# Patient Record
Sex: Male | Born: 1985 | Race: White | Hispanic: No | Marital: Married | Smoking: Current every day smoker
Health system: Southern US, Community
[De-identification: ages and names within clinical notes are randomized; demographics above are authoritative.]

## PROBLEM LIST (undated history)

## (undated) DIAGNOSIS — M549 Dorsalgia, unspecified: Secondary | ICD-10-CM

## (undated) DIAGNOSIS — R110 Nausea: Secondary | ICD-10-CM

## (undated) DIAGNOSIS — M62830 Muscle spasm of back: Secondary | ICD-10-CM

## (undated) HISTORY — DX: Muscle spasm of back: M62.830

## (undated) HISTORY — DX: Dorsalgia, unspecified: M54.9

## (undated) HISTORY — DX: Nausea: R11.0

---
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
## 2012-06-26 ENCOUNTER — Emergency Department (HOSPITAL_COMMUNITY)
Admission: EM | Admit: 2012-06-26 | Discharge: 2012-06-26 | Disposition: A | Discharge status: Home / Self Care | Payer: Self-pay | Attending: Emergency Medicine | Admitting: Emergency Medicine

## 2012-06-26 ENCOUNTER — Encounter (HOSPITAL_COMMUNITY): Payer: Self-pay | Admitting: Emergency Medicine

## 2012-06-26 DIAGNOSIS — R197 Diarrhea, unspecified: Secondary | ICD-10-CM | POA: Insufficient documentation

## 2012-06-26 DIAGNOSIS — R112 Nausea with vomiting, unspecified: Secondary | ICD-10-CM | POA: Insufficient documentation

## 2012-06-26 DIAGNOSIS — IMO0001 Reserved for inherently not codable concepts without codable children: Secondary | ICD-10-CM | POA: Insufficient documentation

## 2012-06-26 DIAGNOSIS — R229 Localized swelling, mass and lump, unspecified: Secondary | ICD-10-CM | POA: Insufficient documentation

## 2012-06-26 DIAGNOSIS — Z87891 Personal history of nicotine dependence: Secondary | ICD-10-CM | POA: Insufficient documentation

## 2012-06-26 DIAGNOSIS — R1033 Periumbilical pain: Secondary | ICD-10-CM | POA: Insufficient documentation

## 2012-06-26 LAB — BASIC METABOLIC PANEL
GFR calc non Af Amer: >90 mL/min (ref >90)
Glucose, Bld: 84 mg/dL (ref 70–99)
Sodium: 142 mEq/L (ref 135–145)

## 2012-06-26 LAB — CBC WITH DIFFERENTIAL/PLATELET
Basophils Absolute: 0 10*3/uL (ref 0.0–0.1)
Basophils Relative: 0 % (ref 0–1)
Eosinophils Absolute: 0.1 10*3/uL (ref 0.0–0.7)
Eosinophils Relative: 2 % (ref 0–5)
Lymphs Abs: 1 10*3/uL (ref 0.7–4.0)
MCHC: 36 g/dL (ref 30.0–36.0)
Monocytes Absolute: 0.6 10*3/uL (ref 0.1–1.0)
Monocytes Relative: 10 % (ref 3–12)
RDW: 12.7 % (ref 11.5–15.5)
WBC: 5.7 10*3/uL (ref 4.0–10.5)

## 2012-06-26 MED ORDER — ONDANSETRON 4 MG PO TBDP: 4.0000 mg | ORAL_TABLET | Freq: Three times a day (TID) | ORAL | Status: DC | PRN

## 2012-06-26 MED ORDER — SODIUM CHLORIDE 0.9 % IV BOLUS (SEPSIS)
1000.0000 mL | Freq: Once | INTRAVENOUS | Status: AC
  Administered 2012-06-26: 1000 mL via INTRAVENOUS

## 2012-06-26 MED ORDER — ONDANSETRON HCL 4 MG/2ML IJ SOLN
4.0000 mg | Freq: Once | INTRAMUSCULAR | Status: AC
  Administered 2012-06-26: 4 mg via INTRAVENOUS
  Filled 2012-06-26: qty 2

## 2012-06-26 NOTE — ED Notes (Signed)
States has had "flu like symptoms" for two days- said vomited blood "bright red approx 1 T this am" vomited once last night, diarrhea for 2 days. Has not taken temp at home but says had chills and sweats at night

## 2012-06-26 NOTE — ED Notes (Signed)
Pt has small pea sized knot in left lower quad, pea sized knot right upper quad, pea sized knot on back of right and left upper arms.

## 2012-06-26 NOTE — ED Provider Notes (Signed)
History     CSN: [id number]  Arrival date & time 06/26/12  0712   First MD Initiated Contact with Patient 06/26/12 [phone]      Chief Complaint  Patient presents with  . Emesis  . Generalized Body Aches    (Consider location/radiation/quality/duration/timing/severity/associated sxs/prior treatment) HPI Comments: N/v/ hemoptysis, sweates, chills, runny nose and 1 episode of hematemsis.  Periumbilical pain 3/10 in intensity which is intermittent.  pepto helps.  Nothing makes worse, 4 episodes of diarrhea which was watery.  Sx started 2 days ago, acute in onset.  He is concerned about hemoptysis.   Patient is a 27 y.o. male presenting with vomiting. The history is provided by the patient.  Emesis   History reviewed. No pertinent past medical history.  Past Surgical History  Procedure Laterality Date  . Hernia repair  1995    No family history on file.  History  Substance Use Topics  . Smoking status: Former Games developer  . Smokeless tobacco: Not on file  . Alcohol Use: Yes     Comment: socially      Review of Systems  Gastrointestinal: Positive for vomiting.  All other systems reviewed and are negative.    Allergies  Review of patient's allergies indicates no known allergies.  Home Medications   Current Outpatient Rx  Name  Route  Sig  Dispense  Refill  . ibuprofen (ADVIL,MOTRIN) 200 MG tablet   Oral   Take 200 mg by mouth every 6 (six) hours as needed for pain.           BP 130/72  Pulse 83  Temp(Src) 98 F (36.7 C) (Oral)  Resp 16  SpO2 97%  Physical Exam  Nursing note and vitals reviewed. Constitutional: He appears well-developed and well-nourished. No distress.  HENT:  Head: Normocephalic and atraumatic.  Mouth/Throat: Oropharynx is clear and moist. No oropharyngeal exudate.  Is opacity is clear, oropharynx is clear and moist, no erythema exudate asymmetry or hypertrophy, no blood in the oropharynx  Eyes: Conjunctivae and EOM are normal. Pupils are  equal, round, and reactive to light. Right eye exhibits no discharge. Left eye exhibits no discharge. No scleral icterus.  Neck: Normal range of motion. Neck supple. No JVD present. No thyromegaly present.  Cardiovascular: Normal rate, regular rhythm, normal heart sounds and intact distal pulses.  Exam reveals no gallop and no friction rub.   No murmur heard. Pulmonary/Chest: Effort normal and breath sounds normal. No respiratory distress. He has no wheezes. He has no rales.  Abdominal: Soft. Bowel sounds are normal. He exhibits no distension and no mass. There is tenderness ( Abdomen is nontender, soft, round, obese, subcutaneous nodule in the right upper quadrant which is nontender and mobile in robbery).  Musculoskeletal: Normal range of motion. He exhibits no edema and no tenderness.  Lymphadenopathy:    He has no cervical adenopathy.  Neurological: He is alert. Coordination normal.  Skin: Skin is warm and dry. No rash noted. No erythema.  Psychiatric: He has a normal mood and affect. His behavior is normal.    ED Course  Procedures (including critical care time)  Labs Reviewed  CBC WITH DIFFERENTIAL  BASIC METABOLIC PANEL   No results found.   1. Nausea vomiting and diarrhea   2. Subcutaneous nodules       MDM  Overall the patient appears well, he has normal vital signs, he has nausea vomiting and several episodes of watery diarrhea over the last several days, one episode of hematemesis  today. I doubt that this is a significant source, he has no history of peptic ulcer disease that he reports to nursing, he does not take any medications for gastritis, acid reflux or peptic ulcer disease. We'll provide the patient with antiemetics, IV fluids, blood counts to evaluate for source of these nodules which are subcutaneous.   Labs are normal, normal potassium and renal function, normal blood counts, patient has improved with Zofran and fluids, stable for discharge. Refer to family Dr.  for nodules and ongoing nausea.     Vida Roller, MD 06/26/12 [phone]

## 2013-07-28 ENCOUNTER — Emergency Department (HOSPITAL_COMMUNITY): Payer: Managed Care, Other (non HMO)

## 2013-07-28 ENCOUNTER — Encounter (HOSPITAL_COMMUNITY): Payer: Self-pay | Admitting: Emergency Medicine

## 2013-07-28 ENCOUNTER — Emergency Department (HOSPITAL_COMMUNITY)
Admission: EM | Admit: 2013-07-28 | Discharge: 2013-07-28 | Disposition: A | Discharge status: Home / Self Care | Payer: Managed Care, Other (non HMO) | Attending: Emergency Medicine | Admitting: Emergency Medicine

## 2013-07-28 DIAGNOSIS — Z87891 Personal history of nicotine dependence: Secondary | ICD-10-CM | POA: Insufficient documentation

## 2013-07-28 DIAGNOSIS — R109 Unspecified abdominal pain: Secondary | ICD-10-CM

## 2013-07-28 DIAGNOSIS — M545 Low back pain, unspecified: Secondary | ICD-10-CM | POA: Insufficient documentation

## 2013-07-28 DIAGNOSIS — M549 Dorsalgia, unspecified: Secondary | ICD-10-CM

## 2013-07-28 DIAGNOSIS — R6883 Chills (without fever): Secondary | ICD-10-CM | POA: Insufficient documentation

## 2013-07-28 DIAGNOSIS — R3919 Other difficulties with micturition: Secondary | ICD-10-CM | POA: Insufficient documentation

## 2013-07-28 DIAGNOSIS — R61 Generalized hyperhidrosis: Secondary | ICD-10-CM | POA: Insufficient documentation

## 2013-07-28 DIAGNOSIS — R1031 Right lower quadrant pain: Secondary | ICD-10-CM | POA: Insufficient documentation

## 2013-07-28 DIAGNOSIS — R112 Nausea with vomiting, unspecified: Secondary | ICD-10-CM | POA: Insufficient documentation

## 2013-07-28 LAB — BASIC METABOLIC PANEL
BUN: 16 mg/dL (ref 6–23)
CO2: 24 mEq/L (ref 19–32)
Calcium: 9.8 mg/dL (ref 8.4–10.5)
Chloride: 100 mEq/L (ref 96–112)
Creatinine, Ser: 0.92 mg/dL (ref 0.50–1.35)
GFR calc Af Amer: >90 mL/min (ref >90)
Glucose, Bld: 132 mg/dL — ABNORMAL HIGH (ref 70–99)
POTASSIUM: 3.8 meq/L (ref 3.7–5.3)
SODIUM: 139 meq/L (ref 137–147)

## 2013-07-28 LAB — URINALYSIS, ROUTINE W REFLEX MICROSCOPIC
Glucose, UA: NEGATIVE mg/dL
Hgb urine dipstick: NEGATIVE
Ketones, ur: 15 mg/dL — AB
LEUKOCYTES UA: NEGATIVE
Nitrite: NEGATIVE
PROTEIN: NEGATIVE mg/dL
Specific Gravity, Urine: 1.035 — ABNORMAL HIGH (ref 1.005–1.030)
Urobilinogen, UA: 0.2 mg/dL (ref 0.0–1.0)
pH: 6 (ref 5.0–8.0)

## 2013-07-28 LAB — CBC
HCT: 45.1 % (ref 39.0–52.0)
HEMOGLOBIN: 16.1 g/dL (ref 13.0–17.0)
MCH: 30.8 pg (ref 26.0–34.0)
MCHC: 35.7 g/dL (ref 30.0–36.0)
MCV: 86.4 fL (ref 78.0–100.0)
Platelets: 240 10*3/uL (ref 150–400)
RBC: 5.22 MIL/uL (ref 4.22–5.81)
RDW: 12.9 % (ref 11.5–15.5)
WBC: 6.9 10*3/uL (ref 4.0–10.5)

## 2013-07-28 MED ORDER — IOHEXOL 300 MG/ML  SOLN
100.0000 mL | Freq: Once | INTRAMUSCULAR | Status: AC | PRN
  Administered 2013-07-28: 100 mL via INTRAVENOUS

## 2013-07-28 MED ORDER — CYCLOBENZAPRINE HCL 10 MG PO TABS: 10.0000 mg | ORAL_TABLET | Freq: Two times a day (BID) | ORAL | Status: DC | PRN

## 2013-07-28 MED ORDER — ONDANSETRON HCL 4 MG/2ML IJ SOLN
4.0000 mg | Freq: Once | INTRAMUSCULAR | Status: AC
  Administered 2013-07-28: 4 mg via INTRAVENOUS
  Filled 2013-07-28: qty 2

## 2013-07-28 MED ORDER — SODIUM CHLORIDE 0.9 % IV BOLUS (SEPSIS)
1000.0000 mL | Freq: Once | INTRAVENOUS | Status: AC
  Administered 2013-07-28: 1000 mL via INTRAVENOUS

## 2013-07-28 MED ORDER — MORPHINE SULFATE 4 MG/ML IJ SOLN
4.0000 mg | Freq: Once | INTRAMUSCULAR | Status: AC
  Administered 2013-07-28: 4 mg via INTRAVENOUS
  Filled 2013-07-28: qty 1

## 2013-07-28 MED ORDER — IOHEXOL 300 MG/ML  SOLN: 25.0000 mL | INTRAMUSCULAR | Status: AC

## 2013-07-28 MED ORDER — HYDROCODONE-ACETAMINOPHEN 5-325 MG PO TABS: 1.0000 | ORAL_TABLET | ORAL | Status: DC | PRN

## 2013-07-28 MED ORDER — PROMETHAZINE HCL 25 MG PO TABS: 25.0000 mg | ORAL_TABLET | Freq: Four times a day (QID) | ORAL | Status: AC | PRN

## 2013-07-28 NOTE — ED Notes (Signed)
He woke this am with severe R sided flank, hip and lower back pain as well as urinary hesitancy.

## 2013-07-28 NOTE — Discharge Instructions (Signed)
Stay hydrated.   Take vicodin for pain. Do NOT drive with it.   Take phenergan for nausea.  Take flexeril for muscle spasms.   Follow up with your doctor.   Return to ER if you have severe pain, vomiting, dehydration.

## 2013-07-28 NOTE — ED Provider Notes (Signed)
CSN: [id number]     Arrival date & time 07/28/13  1203 History   First MD Initiated Contact with Patient 07/28/13 1425     Chief Complaint  Patient presents with  . Back Pain     (Consider location/radiation/quality/duration/timing/severity/associated sxs/prior Treatment) HPI Comments: Joel Mcclain is a 28 y.o. male with a past medical history of hernia repair presenting the Emergency Department with a chief complaint of Right flank pain since this morning. States RLQ discomfort as sharp cramping discomfort. Right low back pain as constant, radiant discomfort.  He reports standing increase the discomfort and leaning to right side partially relieves symptoms.  He reports multiple episodes of vomiting. He denies history of kidney stone.  He denies hematuria but reports decrease in stream since today. The patient also reports last BM ws yesterday, no blood or pus. Abdominal surgeries: hernia repair in 1995 and has chronic right inguinal pain.  He reports Ibuprofen use without resolution of his symptoms. No PCP    Patient is a 28 y.o. male presenting with back pain. The history is provided by the patient. No language interpreter was used.  Back Pain Associated symptoms: abdominal pain   Associated symptoms: no dysuria and no fever     History reviewed. No pertinent past medical history. Past Surgical History  Procedure Laterality Date  . Hernia repair  1995   History reviewed. No pertinent family history. History  Substance Use Topics  . Smoking status: Former Games developer  . Smokeless tobacco: Not on file  . Alcohol Use: Yes     Comment: socially    Review of Systems  Constitutional: Positive for chills and diaphoresis. Negative for fever.  Gastrointestinal: Positive for nausea, vomiting and abdominal pain. Negative for diarrhea, constipation, blood in stool and anal bleeding.  Genitourinary: Positive for difficulty urinating. Negative for dysuria, hematuria, scrotal swelling and  testicular pain.  Musculoskeletal: Positive for back pain.      Allergies  Review of patient's allergies indicates no known allergies.  Home Medications   Current Outpatient Rx  Name  Route  Sig  Dispense  Refill  . aspirin 325 MG tablet   Oral   Take 325 mg by mouth every 4 (four) hours as needed for mild pain, fever or headache.         . ibuprofen (ADVIL,MOTRIN) 200 MG tablet   Oral   Take 400 mg by mouth every 6 (six) hours as needed for fever, headache or mild pain.           BP 148/103  Pulse 74  Temp(Src) 98.1 F (36.7 C) (Oral)  Resp 22  Wt 220 lb (99.791 kg)  SpO2 100% Physical Exam  Nursing note and vitals reviewed. Constitutional: He is oriented to person, place, and time. He appears well-developed and well-nourished. No distress.  HENT:  Head: Normocephalic and atraumatic.  Eyes: EOM are normal. Pupils are equal, round, and reactive to light.  Neck: Neck supple.  Cardiovascular: Normal rate and regular rhythm.   Pulmonary/Chest: Effort normal and breath sounds normal. No respiratory distress. He has no wheezes. He has no rales.  Abdominal: Soft. Normal appearance. He exhibits no distension. There is tenderness in the right lower quadrant. There is guarding and tenderness at McBurney's point. There is no rigidity, no rebound, no CVA tenderness and negative Murphy's sign. Hernia confirmed negative in the right inguinal area and confirmed negative in the left inguinal area.  Genitourinary: Testes normal and penis normal.    Right testis  shows no swelling and no tenderness. Left testis shows no swelling and no tenderness. No penile erythema.  Moderate tenderness to right groin/ inguinal. Negative prehn's test  Musculoskeletal: Normal range of motion.       Back:  Discomfort reproducible with palpation of the right flank  Lymphadenopathy:       Right: No inguinal adenopathy present.       Left: No inguinal adenopathy present.  Neurological: He is alert  and oriented to person, place, and time.  Skin: Skin is warm and dry. No rash noted. He is not diaphoretic.  Rubbery mobile subcutaneous nodule in left lower abdomen, and right lower back. No overlying erythema.   Psychiatric: He has a normal mood and affect. His behavior is normal. Thought content normal.    ED Course  Procedures (including critical care time) Labs Review Labs Reviewed  BASIC METABOLIC PANEL - Abnormal; Notable for the following:    Glucose, Bld 132 (*)    All other components within normal limits  URINALYSIS, ROUTINE W REFLEX MICROSCOPIC - Abnormal; Notable for the following:    Specific Gravity, Urine 1.035 (*)    Bilirubin Urine SMALL (*)    Ketones, ur 15 (*)    All other components within normal limits  CBC   Imaging Review No results found.   EKG Interpretation None      MDM   Final diagnoses:  Abdominal pain  Back pain   Pt with RLQ discomfort and right low back pain since today.  Back pain reproducible with palpation, of soft tissue, No CVA tenderness.  RLQ discomfort with palpation.  Questionable appendicitis vs. Renal stone. Afebrile, no leukocytosis. UA, dehydrated without  Hbg or infection. Pt care assumed by Dr. Chaney Mallingavid Yao at shift change, awaiting CT and results.  Meds given in ED:  Medications  sodium chloride 0.9 % bolus 1,000 mL (not administered)  morphine 4 MG/ML injection 4 mg (not administered)  ondansetron (ZOFRAN) injection 4 mg (not administered)    New Prescriptions   No medications on file        Clabe SealLauren M Bryant Lipps, PA-C 07/28/13 2040

## 2013-07-28 NOTE — ED Notes (Signed)
Patient discharged to home with family. NAD.  

## 2013-07-28 NOTE — ED Provider Notes (Addendum)
  Physical Exam  BP 101/48  Pulse 66  Temp(Src) 98.1 F (36.7 C) (Oral)  Resp 18  Wt 220 lb (99.791 kg)  SpO2 98%  Physical Exam  ED Course  Procedures  Care assumed at sign out from JacksonLauren, GeorgiaPA. Sign out pending CT ab/pel for abdominal pain. CT unremarkable. Pain improved. UA showed mild dehydration. Tenderness on back muscles. I think likely MSK in nature. Felt better with meds, IVF. Will d/c home.       Richardean Canalavid H Yao, MD 07/28/13 2015  Richardean Canalavid H Yao, MD 07/28/13 2018

## 2013-07-28 NOTE — ED Notes (Signed)
Patient transported to CT 

## 2013-07-28 NOTE — ED Notes (Signed)
This NT was present with the  PA during genital exam. Tolerated well

## 2013-07-29 NOTE — ED Provider Notes (Signed)
Medical screening examination/treatment/procedure(s) were performed by non-physician practitioner and as supervising physician I was immediately available for consultation/collaboration.   EKG Interpretation None        Dagmar HaitWilliam Sevanna Ballengee, MD 07/29/13 [phone]

## 2014-01-26 ENCOUNTER — Ambulatory Visit (INDEPENDENT_AMBULATORY_CARE_PROVIDER_SITE_OTHER): Payer: Managed Care, Other (non HMO) | Admitting: Family Medicine

## 2014-01-26 ENCOUNTER — Telehealth: Payer: Self-pay | Admitting: *Deleted

## 2014-01-26 ENCOUNTER — Encounter: Payer: Self-pay | Admitting: Family Medicine

## 2014-01-26 VITALS — BP 100/74 | HR 83 | Temp 98.1°F | Ht 68.75 in | Wt 215.5 lb

## 2014-01-26 DIAGNOSIS — F418 Other specified anxiety disorders: Secondary | ICD-10-CM

## 2014-01-26 DIAGNOSIS — M545 Low back pain, unspecified: Secondary | ICD-10-CM

## 2014-01-26 DIAGNOSIS — Z7189 Other specified counseling: Secondary | ICD-10-CM

## 2014-01-26 DIAGNOSIS — G8929 Other chronic pain: Secondary | ICD-10-CM

## 2014-01-26 DIAGNOSIS — F329 Major depressive disorder, single episode, unspecified: Secondary | ICD-10-CM

## 2014-01-26 DIAGNOSIS — Z7689 Persons encountering health services in other specified circumstances: Secondary | ICD-10-CM

## 2014-01-26 DIAGNOSIS — G2581 Restless legs syndrome: Secondary | ICD-10-CM

## 2014-01-26 DIAGNOSIS — F419 Anxiety disorder, unspecified: Secondary | ICD-10-CM

## 2014-01-26 DIAGNOSIS — R110 Nausea: Secondary | ICD-10-CM

## 2014-01-26 LAB — COMPREHENSIVE METABOLIC PANEL
ALT: 21 U/L (ref 0–53)
AST: 18 U/L (ref 0–37)
Albumin: 4.6 g/dL (ref 3.5–5.2)
Alkaline Phosphatase: 84 U/L (ref 39–117)
BUN: 15 mg/dL (ref 6–23)
CO2: 27 mEq/L (ref 19–32)
Calcium: 9.9 mg/dL (ref 8.4–10.5)
Chloride: 104 mEq/L (ref 96–112)
Creatinine, Ser: 1 mg/dL (ref 0.4–1.5)
GFR: 94.39 mL/min (ref >60.00)
Glucose, Bld: 78 mg/dL (ref 70–99)
Potassium: 3.9 mEq/L (ref 3.5–5.1)
Sodium: 138 mEq/L (ref 135–145)
Total Bilirubin: 1 mg/dL (ref 0.2–1.2)
Total Protein: 7.7 g/dL (ref 6.0–8.3)

## 2014-01-26 LAB — H. PYLORI ANTIBODY, IGG: H PYLORI IGG: NEGATIVE

## 2014-01-26 NOTE — Patient Instructions (Addendum)
-We have ordered labs or studies at this visit. It can take up to 1-2 weeks for results and processing. We will contact you with instructions IF your results are abnormal. Normal results will be released to your [facility]. If you have not heard from us or can not find your results in [facility] in 2 weeks please contact our office.  -get the xrays of the back  -do the back exercises AT LEAST 4 days per week  -schedule Cognitive Behavioral Therapy for the anxiety and consider a medication called Paxil  -PLEASE SIGN UP FOR MYCHART TODAY   We recommend the following healthy lifestyle measures: - eat a healthy diet consisting of lots of vegetables, fruits, beans, nuts, seeds, healthy meats such as white chicken and fish and whole grains.  - avoid fried foods, fast food, processed foods, sodas, red meet and other fattening foods.  - get a least 150 minutes of aerobic exercise per week.   Follow up in: 1 month   Treatment Options for Obesity:  A comprehensive exercise and diet program are advised and are the initial and longterm/lifelong treatment for obesity.  This is a long and initially often difficult process and it will often take years to get to reach optimal health  As you get healthier you will build extra blood volume and lean tissue that is heavier then fatty tissue and you will hit plateaus in your weight - thus I ADVISE YOU DO NOT FOLLOW YOUR WEIGHT ON A SCALE   The safest and healthiest way to improve you physical and mental health is to: - eat a healthy diet consisting of lots of vegetables, fruits, beans, nuts, seeds, healthy meats such as white chicken and fish and whole grains.  - avoid fried foods, fast food, processed foods, sodas, red meet and other fattening foods.  - get a least 150-300 minutes of aerobic exercise per week.  -reduce stress - counseling, meditation, relaxation to balance other aspects of your life   Medication for Obesity: IF BMI >30 and you have not  achieved weight loss through diet and exercise alone, we suggest pharmacologic therapy can be added to diet and exercise.  First Choice:  1)Orlistat (Xenical): -likely the safest in terms of heart and vascular side effects and helps cholesterol - usually recommended for 2 - 4 years -common and or serious side effects include but are not limited to: anaphylaxis, liver toxicity, gas, oily stools, loose stools, fatty stools -150 mg up to 3 times daily only during meals containing fat, diet should be <30% fat -not recommended in: pregnancy, malabsorption, some kidney stones, anorexia or bulemia history  2)Lorcaserin (Belviq) -no long term safety data -to be discontinued if do not achieve 5% body weight reduction in 12 weeks -common and or serious side effects include but are not limited to: heart disease, anemia, abuse and dependency, depression, suicide, nausea and vomiting, headaches, low blood sugar, dizziness, anemia, fatigue, diarrhea, urinary tract infections, back pain, dry mouth, pain, cognitive impairment 10mg  twice daily -not recommended if pregnant, kidney disease, heart disease, diabetes, depression -I do not advise long term and advise weaning of if not effective and after 1-2 years  3)combination phentermine-topiramate (Qsymia)- for men or post menopausal women or monthly pregnancy test: -can cause fetal anomalies -to be discontinued GRADUALLY if do not achieve 5% body weight reduction in 12 weeks -adverse: -not recommended if: heart disease, breastfeeding, pregnant, hyperthyroid, glaucoma, drug abuse history, renal or liver problems, alcohol use, depression -common and or serious  side effects include but are not limited to: metabolic acidosis, kidney stones, bone issues, electrolyte issues, heart attack, suicide, psychosis, dependency and abuse, severe reaction, seizure is stop suddenly, dry mouth, numbness, headache, blurred vision, diarrhea, fatigue, depression, anxiety, pain,  poor focus, acid reflux, dry eyes, eye pain, heart arhythmia -I do not advise long term and advise weaning of if not effective and in 1-2 years  If diabetes:  1) Metformin, orlistat or  2)Liraglutide (Victoza) -can cause t cell tumors, anaphylaxis, kidney toxicity, pancreatitis, nausea, vomiting, diarrhea, headache -injected:0.6mg  daily for 1 week, then 1.2mg  daily  Surgery is also an option and [facility] has many useful resources if you are considering this.  Please check out this website if you wish:  [email or web address]

## 2014-01-26 NOTE — Telephone Encounter (Signed)
I called the pt and informed him Dr Selena BattenKim completed the eHealth Screenings form and this was mailed to his home address to have him complete his email address and sign.  A copy was sent to be scanned.

## 2014-01-26 NOTE — Progress Notes (Addendum)
No chief complaint on file.   HPI:  Joel Mcclain is here to establish care. Had health screening with [employer] through his wife's job - had lipid, diabetes screening and this was normal. Told to see PCP for BMI in obese range.  Has the following chronic problems and concerns today:  There are no active problems to display for this patient.  Chronic Low back Back Pain: -started in highschool when playing sports -reports no real work up in the past -uses ibuprofen 200-400mg  almost daily - reports aleve hurt his kidneys Denies: malaise, fevers, weakness, numbness, bowel or bladder incontinence, radiation  RLS: -feels like his legs want to move -never on medication for this  ? Anxiety or Depression/Nausea: -chronically has worry about everything all the time, occ panic attacks, never in active combat, was in the [employer] -intermittent depressed mood -for over 1 year -feels nausea 3-4 days per week in the morning -once he eats breakfast he is fine -denies: weight loss, diarrhea, vomiting, constipation, melena, hematochezia, no pain -told this is anxiety, he uses phenergan for this -has never taken a medication, never hospitalized -saw a psychiatrist remotely   ROS negative for unless reported above: fevers, unintentional weight loss, hearing or vision loss, chest pain, palpitations, struggling to breath, hemoptysis, melena, hematochezia, hematuria, falls, loc, si, thoughts of self harm  Past Medical History  Diagnosis Date  . Nausea   . Back pain   . Spasm of back muscles     Family History  Problem Relation Age of Onset  . Cancer Mother     breast  . Heart disease Mother     chf  . Hyperlipidemia Sister   . Hypertension Sister     History   Social History  . Marital Status: Married    Spouse Name: N/A    Number of Children: N/A  . Years of Education: N/A   Social History Main Topics  . Smoking status: Former Games developer  . Smokeless tobacco: None     Comment:  quit in 2010  . Alcohol Use: Yes     Comment: socially; 1 drink once per month  . Drug Use: None  . Sexual Activity: None   Other Topics Concern  . None   Social History Narrative   Work or School: works as a Investment banker, operational at [employer] - used to be in [employer], not in [employer] Situation: lives with wife and one child on the way in 2016 (april 20th)      Spiritual Beliefs: Buhdist      Lifestyle: no regular exercise, diet is poor             Current outpatient prescriptions:ibuprofen (ADVIL,MOTRIN) 200 MG tablet, Take 400 mg by mouth every 6 (six) hours as needed for fever, headache or mild pain. , Disp: , Rfl: ;  promethazine (PHENERGAN) 25 MG tablet, Take 1 tablet (25 mg total) by mouth every 6 (six) hours as needed for nausea or vomiting., Disp: 20 tablet, Rfl: 0  EXAM:  Filed Vitals:   01/26/14 0934  BP: 100/74  Pulse: 83  Temp: 98.1 F (36.7 C)    Body mass index is 32.06 kg/(m^2).  GENERAL: vitals reviewed and listed above, alert, oriented, appears well hydrated and in no acute distress  HEENT: atraumatic, conjunttiva clear, no obvious abnormalities on inspection of external nose and ears  NECK: no obvious masses on inspection  LUNGS: clear to auscultation bilaterally, no wheezes, rales  or rhonchi, good air movement  CV: HRRR, no peripheral edema  ABD: BS+, sofft, NTTP  Normal Gait Normal inspection of back, no obvious scoliosis or leg length descrepancy No bony TTP bilat lumbar paraspinal muscles Soft tissue TTP at: -/+ tests: neg trendelenburg,-facet loading, -SLRT, -CLRT, -FABER, -FADIR Normal muscle strength, sensation to light touch and DTRs in LEs bilaterally  MS: moves all extremities without noticeable abnormality  PSYCH: pleasant and cooperative, no obvious depression or anxiety  ASSESSMENT AND PLAN:  Discussed the following assessment and plan: Greater then 45 minutes spent with this patient with well over 50% of the visit on counseling  on the following:  Encounter to establish care -We reviewed the PMH, PSH, FH, SH, Meds and Allergies. -We provided refills for any medications we will prescribe as needed. -We addressed current concerns per orders and patient instructions. -We have asked for records for pertinent exams, studies, vaccines and notes from previous providers. -We have advised patient to follow up per instructions below. Anxiety and depression  RLS (restless legs syndrome)  Chronic low back pain - Plan: DG Lumbar Spine Complete -benign exam -etiologies discussed -xrays, HEP, tylenol as needed, follow up in 1 month  Nausea without vomiting - Plan: CMP, H. pylori Antibody, IgG, t-Transglutaminase (tTG) IgG -we discussed possible serious and likely etiologies, workup and treatment, treatment risks and return precautions -after this discussion, Joel Mcclain opted for labs, posisble GI referral if persists and tx of anxiety -follow up advised in 1 month -of course, we advised Joel Mcclain  to return or notify a doctor immediately if symptoms worsen or persist or new concerns arise.  Obesity: -discussed treatment per below at length   -Patient advised to return or notify a doctor immediately if symptoms worsen or persist or new concerns arise.  Patient Instructions  -We have ordered labs or studies at this visit. It can take up to 1-2 weeks for results and processing. We will contact you with instructions IF your results are abnormal. Normal results will be released to your [facility]. If you have not heard from us or can not find your results in [facility] in 2 weeks please contact our office.  -get the xrays of the back  -do the back exercises AT LEAST 4 days per week  -schedule Cognitive Behavioral Therapy for the anxiety and consider a medication called Paxil  -PLEASE SIGN UP FOR MYCHART TODAY   We recommend the following healthy lifestyle measures: - eat a healthy diet consisting of lots of vegetables, fruits, beans,  nuts, seeds, healthy meats such as white chicken and fish and whole grains.  - avoid fried foods, fast food, processed foods, sodas, red meet and other fattening foods.  - get a least 150 minutes of aerobic exercise per week.   Follow up in: 1 month   Treatment Options for Obesity:  A comprehensive exercise and diet program are advised and are the initial and longterm/lifelong treatment for obesity.  This is a long and initially often difficult process and it will often take years to get to reach optimal health  As you get healthier you will build extra blood volume and lean tissue that is heavier then fatty tissue and you will hit plateaus in your weight - thus I ADVISE YOU DO NOT FOLLOW YOUR WEIGHT ON A SCALE   The safest and healthiest way to improve you physical and mental health is to: - eat a healthy diet consisting of lots of vegetables, fruits, beans, nuts, seeds, healthy meats such  as white chicken and fish and whole grains.  - avoid fried foods, fast food, processed foods, sodas, red meet and other fattening foods.  - get a least 150-300 minutes of aerobic exercise per week.  -reduce stress - counseling, meditation, relaxation to balance other aspects of your life   Medication for Obesity: IF BMI >30 and you have not achieved weight loss through diet and exercise alone, we suggest pharmacologic therapy can be added to diet and exercise.  First Choice:  1)Orlistat (Xenical): -likely the safest in terms of heart and vascular side effects and helps cholesterol - usually recommended for 2 - 4 years -common and or serious side effects include but are not limited to: anaphylaxis, liver toxicity, gas, oily stools, loose stools, fatty stools -150 mg up to 3 times daily only during meals containing fat, diet should be <30% fat -not recommended in: pregnancy, malabsorption, some kidney stones, anorexia or bulemia history  2)Lorcaserin (Belviq) -no long term safety data -to be  discontinued if do not achieve 5% body weight reduction in 12 weeks -common and or serious side effects include but are not limited to: heart disease, anemia, abuse and dependency, depression, suicide, nausea and vomiting, headaches, low blood sugar, dizziness, anemia, fatigue, diarrhea, urinary tract infections, back pain, dry mouth, pain, cognitive impairment 10mg  twice daily -not recommended if pregnant, kidney disease, heart disease, diabetes, depression -I do not advise long term and advise weaning of if not effective and after 1-2 years  3)combination phentermine-topiramate (Qsymia)- for men or post menopausal women or monthly pregnancy test: -can cause fetal anomalies -to be discontinued GRADUALLY if do not achieve 5% body weight reduction in 12 weeks -adverse: -not recommended if: heart disease, breastfeeding, pregnant, hyperthyroid, glaucoma, drug abuse history, renal or liver problems, alcohol use, depression -common and or serious side effects include but are not limited to: metabolic acidosis, kidney stones, bone issues, electrolyte issues, heart attack, suicide, psychosis, dependency and abuse, severe reaction, seizure is stop suddenly, dry mouth, numbness, headache, blurred vision, diarrhea, fatigue, depression, anxiety, pain, poor focus, acid reflux, dry eyes, eye pain, heart arhythmia -I do not advise long term and advise weaning of if not effective and in 1-2 years  If diabetes:  1) Metformin, orlistat or  2)Liraglutide (Victoza) -can cause t cell tumors, anaphylaxis, kidney toxicity, pancreatitis, nausea, vomiting, diarrhea, headache -injected:0.6mg  daily for 1 week, then 1.2mg  daily  Surgery is also an option and [facility] has many useful resources if you are considering this.  Please check out this website if you wish:  [email or web address]       KIM, HANNAH R.

## 2014-01-26 NOTE — Progress Notes (Signed)
Pre visit review using our clinic review tool, if applicable. No additional management support is needed unless otherwise documented below in the visit note. 

## 2014-03-01 ENCOUNTER — Encounter: Payer: Managed Care, Other (non HMO) | Admitting: Family Medicine

## 2014-03-01 DIAGNOSIS — Z0289 Encounter for other administrative examinations: Secondary | ICD-10-CM

## 2014-03-01 NOTE — Progress Notes (Signed)
Error   This encounter was created in error - please disregard. 

## 2016-06-06 ENCOUNTER — Emergency Department (HOSPITAL_COMMUNITY)
Admission: EM | Admit: 2016-06-06 | Discharge: 2016-06-06 | Disposition: A | Discharge status: Home / Self Care | Payer: Managed Care, Other (non HMO) | Attending: Emergency Medicine | Admitting: Emergency Medicine

## 2016-06-06 ENCOUNTER — Encounter (HOSPITAL_COMMUNITY): Payer: Self-pay | Admitting: *Deleted

## 2016-06-06 ENCOUNTER — Emergency Department (HOSPITAL_COMMUNITY): Payer: Managed Care, Other (non HMO)

## 2016-06-06 DIAGNOSIS — N202 Calculus of kidney with calculus of ureter: Secondary | ICD-10-CM | POA: Insufficient documentation

## 2016-06-06 DIAGNOSIS — R109 Unspecified abdominal pain: Secondary | ICD-10-CM | POA: Diagnosis present

## 2016-06-06 DIAGNOSIS — F17200 Nicotine dependence, unspecified, uncomplicated: Secondary | ICD-10-CM | POA: Insufficient documentation

## 2016-06-06 DIAGNOSIS — N200 Calculus of kidney: Secondary | ICD-10-CM

## 2016-06-06 LAB — URINALYSIS, ROUTINE W REFLEX MICROSCOPIC
BILIRUBIN URINE: NEGATIVE
Glucose, UA: NEGATIVE mg/dL
Ketones, ur: 5 mg/dL — AB
LEUKOCYTES UA: NEGATIVE
Nitrite: NEGATIVE
PROTEIN: 30 mg/dL — AB
Specific Gravity, Urine: 1.023 (ref 1.005–1.030)
pH: 5 (ref 5.0–8.0)

## 2016-06-06 LAB — LIPASE, BLOOD: LIPASE: 16 U/L (ref 11–51)

## 2016-06-06 LAB — COMPREHENSIVE METABOLIC PANEL
ALT: 46 U/L (ref 17–63)
AST: 28 U/L (ref 15–41)
Albumin: 4.4 g/dL (ref 3.5–5.0)
Alkaline Phosphatase: 76 U/L (ref 38–126)
Anion gap: 14 (ref 5–15)
BUN: 15 mg/dL (ref 6–20)
CO2: 27 mmol/L (ref 22–32)
Calcium: 9.8 mg/dL (ref 8.9–10.3)
Chloride: 98 mmol/L — ABNORMAL LOW (ref 101–111)
Creatinine, Ser: 1.1 mg/dL (ref 0.61–1.24)
GFR calc non Af Amer: >60 mL/min (ref >60)
Glucose, Bld: 82 mg/dL (ref 65–99)
POTASSIUM: 3.4 mmol/L — AB (ref 3.5–5.1)
Sodium: 139 mmol/L (ref 135–145)
Total Bilirubin: 1 mg/dL (ref 0.3–1.2)
Total Protein: 7.9 g/dL (ref 6.5–8.1)

## 2016-06-06 LAB — CBC
HEMATOCRIT: 46.3 % (ref 39.0–52.0)
Hemoglobin: 16.2 g/dL (ref 13.0–17.0)
MCH: 30.8 pg (ref 26.0–34.0)
MCHC: 35 g/dL (ref 30.0–36.0)
MCV: 88 fL (ref 78.0–100.0)
Platelets: 244 10*3/uL (ref 150–400)
RBC: 5.26 MIL/uL (ref 4.22–5.81)
RDW: 13.2 % (ref 11.5–15.5)
WBC: 8.8 10*3/uL (ref 4.0–10.5)

## 2016-06-06 MED ORDER — TAMSULOSIN HCL 0.4 MG PO CAPS: 0.4000 mg | ORAL_CAPSULE | Freq: Every day | ORAL | 0 refills | Status: AC

## 2016-06-06 MED ORDER — SODIUM CHLORIDE 0.9 % IV BOLUS (SEPSIS)
1000.0000 mL | Freq: Once | INTRAVENOUS | Status: AC
  Administered 2016-06-06: 1000 mL via INTRAVENOUS

## 2016-06-06 MED ORDER — MORPHINE SULFATE (PF) 4 MG/ML IV SOLN
4.0000 mg | Freq: Once | INTRAVENOUS | Status: AC
  Administered 2016-06-06: 4 mg via INTRAVENOUS
  Filled 2016-06-06: qty 1

## 2016-06-06 MED ORDER — ONDANSETRON HCL 4 MG/2ML IJ SOLN
4.0000 mg | Freq: Once | INTRAMUSCULAR | Status: AC
  Administered 2016-06-06: 4 mg via INTRAVENOUS
  Filled 2016-06-06: qty 2

## 2016-06-06 MED ORDER — MORPHINE SULFATE 15 MG PO TABS: 15.0000 mg | ORAL_TABLET | ORAL | 0 refills | Status: AC | PRN

## 2016-06-06 NOTE — ED Notes (Addendum)
Patient up to desk.  Expressing frustrations with RN due to wait time and pain.  This RN reviewed chart and offered patient ibuprofen.  Patient states "that's pretty much like drinking water at this point".  This RN encouraged that it could at least help, but it won't hurt.  Apologized for delays and apologized Patient refused and returned to his seat.

## 2016-06-06 NOTE — ED Provider Notes (Signed)
[facility]-EMERGENCY DEPT Provider Note   CSN: [id number] Arrival date & time: 06/06/16  1659     History   Chief Complaint Chief Complaint  Patient presents with  . Flank Pain    HPI Joel Mcclain is a 31 y.o. male.  31 yo M with a chief complaint of left flank pain. This colicky been going on for the past day or so. Patient has sensation that he has trouble urinating. Has never had a kidney stone before. Denies nausea or vomiting. Pain is worse standing and walking. Improves with lying flat. Denies any constipation or diarrhea. Denies pain to the testicles or penis.   The history is provided by the patient.  Flank Pain  This is a new problem. The current episode started 2 days ago. The problem occurs constantly. The problem has not changed since onset.Pertinent negatives include no chest pain, no abdominal pain, no headaches and no shortness of breath. Nothing aggravates the symptoms. Nothing relieves the symptoms. He has tried nothing for the symptoms. The treatment provided no relief.    Past Medical History:  Diagnosis Date  . Back pain   . Nausea   . Spasm of back muscles     There are no active problems to display for this patient.   Past Surgical History:  Procedure Laterality Date  . HERNIA REPAIR  1995       Home Medications    Prior to Admission medications   Medication Sig Start Date End Date Taking? Authorizing Provider  ibuprofen (ADVIL,MOTRIN) 200 MG tablet Take 400 mg by mouth every 6 (six) hours as needed for fever, headache or mild pain.    Yes Historical Provider, MD  morphine (MSIR) 15 MG tablet Take 1 tablet (15 mg total) by mouth every 4 (four) hours as needed for severe pain. 06/06/16   Melene Plan, DO  promethazine (PHENERGAN) 25 MG tablet Take 1 tablet (25 mg total) by mouth every 6 (six) hours as needed for nausea or vomiting. Patient not taking: Reported on 06/06/2016 07/28/13   Charlynne Pander, MD  tamsulosin (FLOMAX) 0.4 MG CAPS capsule Take 1  capsule (0.4 mg total) by mouth daily after supper. 06/06/16   Melene Plan, DO    Family History Family History  Problem Relation Age of Onset  . Cancer Mother     breast  . Heart disease Mother     chf  . Hyperlipidemia Sister   . Hypertension Sister     Social History Social History  Substance Use Topics  . Smoking status: Current Every Day Smoker  . Smokeless tobacco: Never Used     Comment: quit in 2010  . Alcohol use Yes     Comment: socially; 1 drink once per month     Allergies   Patient has no known allergies.   Review of Systems Review of Systems  Constitutional: Negative for chills and fever.  HENT: Negative for congestion and facial swelling.   Eyes: Negative for discharge and visual disturbance.  Respiratory: Negative for shortness of breath.   Cardiovascular: Negative for chest pain and palpitations.  Gastrointestinal: Negative for abdominal pain, diarrhea and vomiting.  Genitourinary: Positive for flank pain.  Musculoskeletal: Negative for arthralgias, back pain and myalgias.  Skin: Negative for color change and rash.  Neurological: Negative for tremors, syncope and headaches.  Psychiatric/Behavioral: Negative for confusion and dysphoric mood.     Physical Exam Updated Vital Signs BP 119/66   Pulse 80   Temp 98.9 F (  37.2 C) (Oral)   Resp 17   SpO2 100%   Physical Exam  Constitutional: He is oriented to person, place, and time. He appears well-developed and well-nourished.  HENT:  Head: Normocephalic and atraumatic.  Eyes: EOM are normal. Pupils are equal, round, and reactive to light.  Neck: Normal range of motion. Neck supple. No JVD present.  Cardiovascular: Normal rate and regular rhythm.  Exam reveals no gallop and no friction rub.   No murmur heard. Pulmonary/Chest: No respiratory distress. He has no wheezes.  Abdominal: He exhibits no distension and no mass. There is no tenderness. There is no rebound and no guarding.  Patient stated  area of tenderness is in the left lower quadrant. Not reproducible on exam. No pain to the penis or testicles. No hernias.  Musculoskeletal: Normal range of motion. He exhibits no edema or tenderness.  Neurological: He is alert and oriented to person, place, and time.  Skin: No rash noted. No pallor.  Psychiatric: He has a normal mood and affect. His behavior is normal.  Nursing note and vitals reviewed.    ED Treatments / Results  Labs (all labs ordered are listed, but only abnormal results are displayed) Labs Reviewed  COMPREHENSIVE METABOLIC PANEL - Abnormal; Notable for the following:       Result Value   Potassium 3.4 (*)    Chloride 98 (*)    All other components within normal limits  URINALYSIS, ROUTINE W REFLEX MICROSCOPIC - Abnormal; Notable for the following:    Color, Urine AMBER (*)    APPearance HAZY (*)    Hgb urine dipstick LARGE (*)    Ketones, ur 5 (*)    Protein, ur 30 (*)    Bacteria, UA RARE (*)    Squamous Epithelial / LPF 0-5 (*)    All other components within normal limits  LIPASE, BLOOD  CBC    EKG  EKG Interpretation None       Radiology Ct Renal Stone Study  Result Date: 06/06/2016 CLINICAL DATA:  Left flank pain in constipation EXAM: CT ABDOMEN AND PELVIS WITHOUT CONTRAST TECHNIQUE: Multidetector CT imaging of the abdomen and pelvis was performed following the standard protocol without IV contrast. COMPARISON:  07/28/2013 FINDINGS: Lower chest: Lung bases demonstrate no acute infiltrate or effusion. Smooth 3 mm perifissural nodule at the right lung base possible lymph node. Normal heart size. Hepatobiliary: No focal liver abnormality is seen. No gallstones, gallbladder wall thickening, or biliary dilatation. Pancreas: Unremarkable. No pancreatic ductal dilatation or surrounding inflammatory changes. Spleen: Normal in size without focal abnormality. Adrenals/Urinary Tract: Adrenal glands within normal limits. Punctate nonobstructing stone upper pole  right kidney best seen on coronal views. No significant left hydronephrosis however left ureter is slightly enlarged. This is secondary to a 2 mm stone in the distal left ureter just above the left UVJ. Bladder otherwise normal. Stomach/Bowel: Stomach is within normal limits. Appendix appears normal. No evidence of bowel wall thickening, distention, or inflammatory changes. Vascular/Lymphatic: No significant vascular findings are present. No enlarged abdominal or pelvic lymph nodes. Reproductive: Prostate is unremarkable. Other: Small fat containing umbilical hernia. No abdominopelvic ascites. Musculoskeletal: No acute or significant osseous findings. IMPRESSION: 1. Slight enlargement of left ureter secondary to a 2 mm stone in the distal left ureter just above the left UVJ. No significant left hydronephrosis. 2. Punctate nonobstructing stone upper pole right kidney. Electronically Signed   By: Jasmine Pang M.D.   On: 06/06/2016 23:13    Procedures Procedures (including  critical care time)  Medications Ordered in ED Medications  morphine 4 MG/ML injection 4 mg (4 mg Intravenous Given 06/06/16 2222)  ondansetron (ZOFRAN) injection 4 mg (4 mg Intravenous Given 06/06/16 2222)  sodium chloride 0.9 % bolus 1,000 mL (0 mLs Intravenous Stopped 06/06/16 2306)     Initial Impression / Assessment and Plan / ED Course  I have reviewed the triage vital signs and the nursing notes.  Pertinent labs & imaging results that were available during my care of the patient were reviewed by me and considered in my medical decision making (see chart for details).     31 yo M With a chief complaint of left flank pain. Colicky in nature. Hematuria in his urine. No history of stones will obtain a CT scan.  CT with 2mm stone, pain well controlled.  D/c home, urology follow up. .  11:23 PM:  I have discussed the diagnosis/risks/treatment options with the patient and family and believe the pt to be eligible for discharge home  to follow-up with PCP. We also discussed returning to the ED immediately if new or worsening sx occur. We discussed the sx which are most concerning (e.g., sudden worsening pain, fever, inability to tolerate by mouth) that necessitate immediate return. Medications administered to the patient during their visit and any new prescriptions provided to the patient are listed below.  Medications given during this visit Medications  morphine 4 MG/ML injection 4 mg (4 mg Intravenous Given 06/06/16 2222)  ondansetron (ZOFRAN) injection 4 mg (4 mg Intravenous Given 06/06/16 2222)  sodium chloride 0.9 % bolus 1,000 mL (0 mLs Intravenous Stopped 06/06/16 2306)     The patient appears reasonably screen and/or stabilized for discharge and I doubt any other medical condition or other [facility] requiring further screening, evaluation, or treatment in the ED at this time prior to discharge.    Final Clinical Impressions(s) / ED Diagnoses   Final diagnoses:  Nephrolithiasis    New Prescriptions New Prescriptions   MORPHINE (MSIR) 15 MG TABLET    Take 1 tablet (15 mg total) by mouth every 4 (four) hours as needed for severe pain.   TAMSULOSIN (FLOMAX) 0.4 MG CAPS CAPSULE    Take 1 capsule (0.4 mg total) by mouth daily after supper.     Melene Planan Jarin Cornfield, DO 06/06/16 2323

## 2016-06-06 NOTE — Discharge Instructions (Signed)

## 2016-06-06 NOTE — ED Triage Notes (Signed)
The pt is c/o lt abd  Flank and lt hip pain all day  No urinary symptoms  The pt  Reports that he has had constipation with his last bm yesterday

## 2018-02-01 IMAGING — CT CT RENAL STONE PROTOCOL
2 of 4 series · 16 of 46 positions shown, 18 images · non-contrast
Comparison: 07/28/2013

CLINICAL DATA: Left flank pain in constipation

EXAM:
CT ABDOMEN AND PELVIS WITHOUT CONTRAST
TECHNIQUE: Multidetector CT imaging of the abdomen and pelvis was performed
following the standard protocol without IV contrast.

[Series 2: stone study 5.0 i30f 1 · axial · 0.87mm/px · z∈[+828,+1293]mm · 13 of 103 slices shown, 15 images]
[im 5/103  soft-tissue]
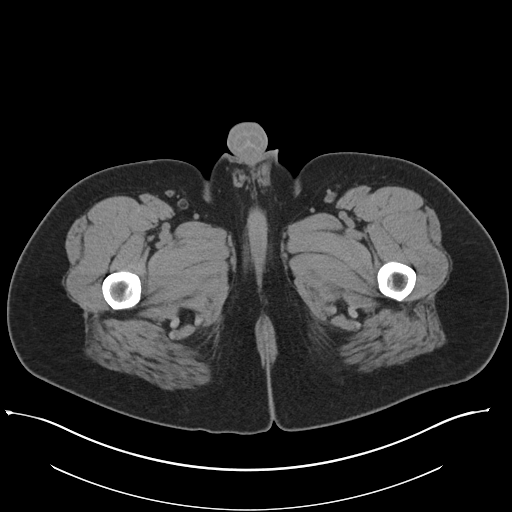
[im 5/103  bone]
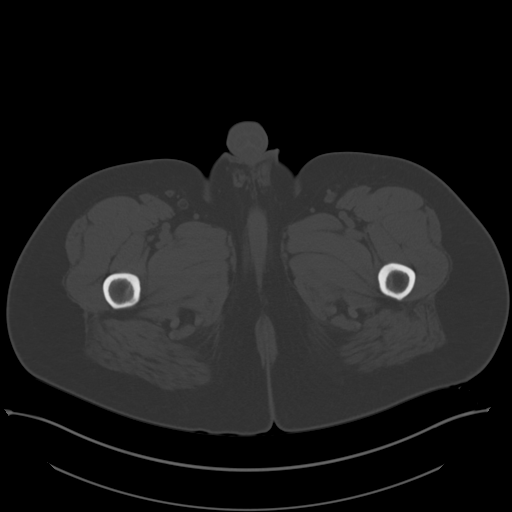
[im 13/103  soft-tissue]
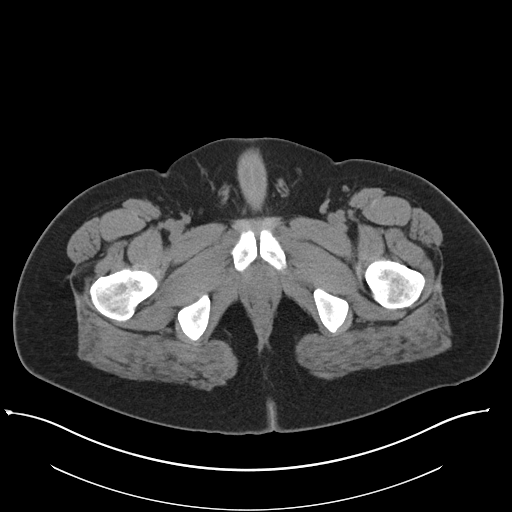
[im 21/103  soft-tissue]
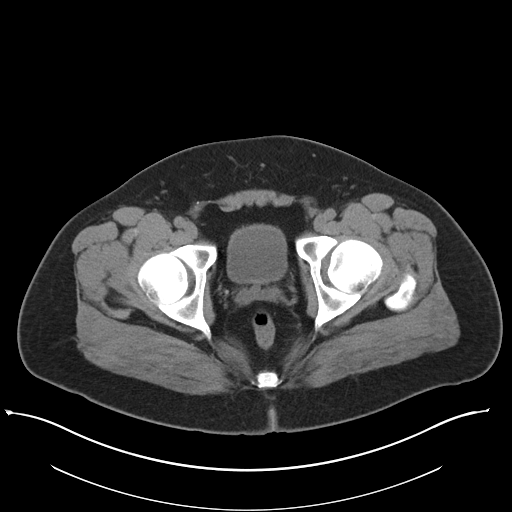
[im 29/103  soft-tissue]
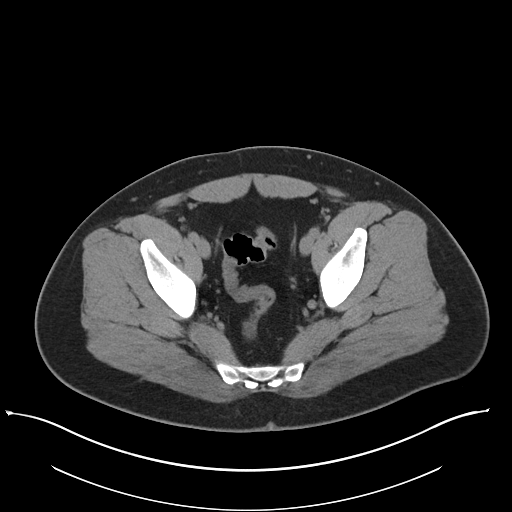
[im 37/103  soft-tissue]
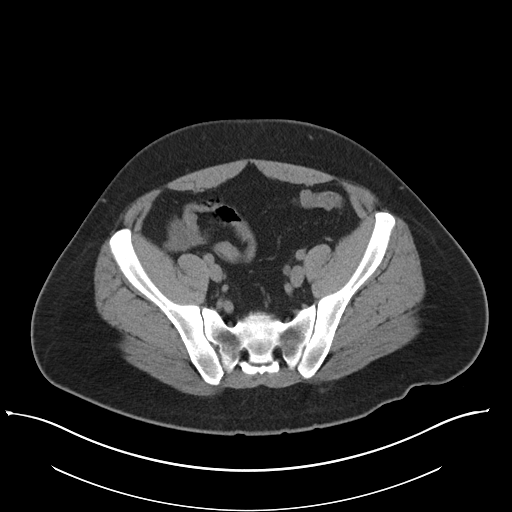
[im 45/103  soft-tissue]
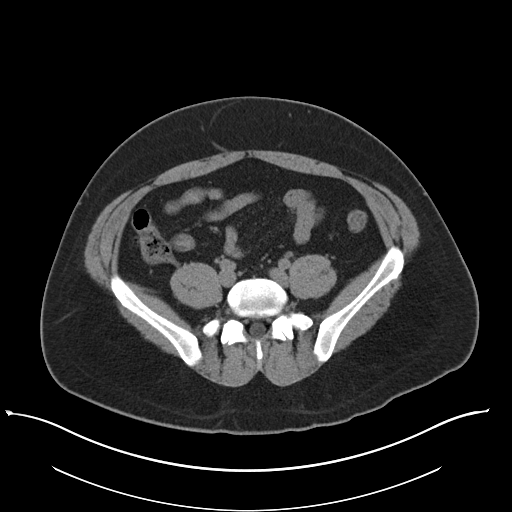
[im 54/103  soft-tissue]
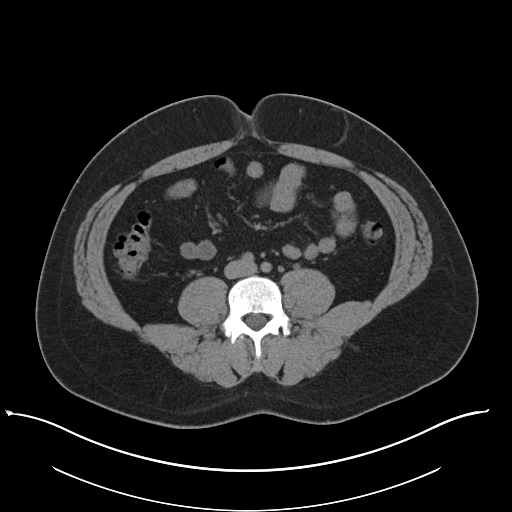
[im 58/103  soft-tissue]
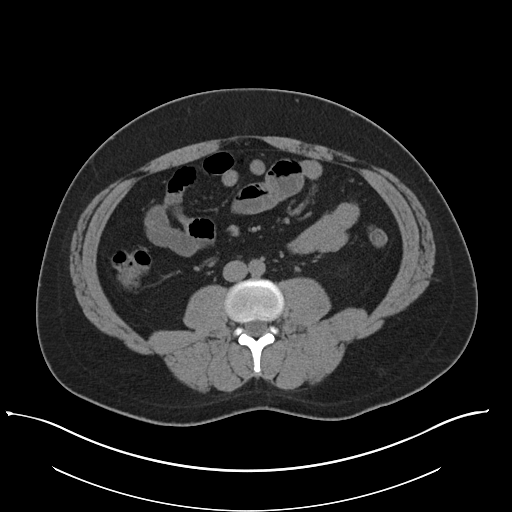
[im 66/103  soft-tissue]
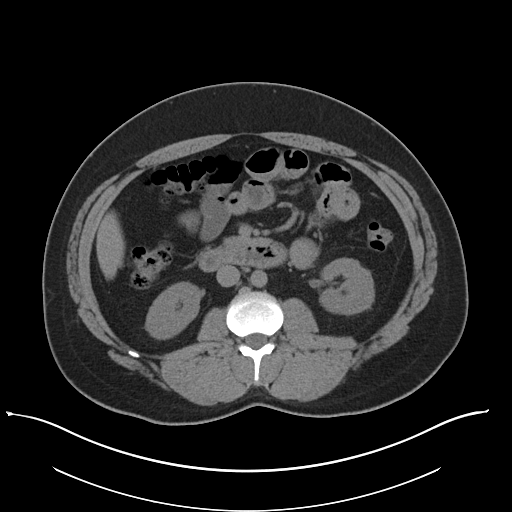
[im 66/103  bone]
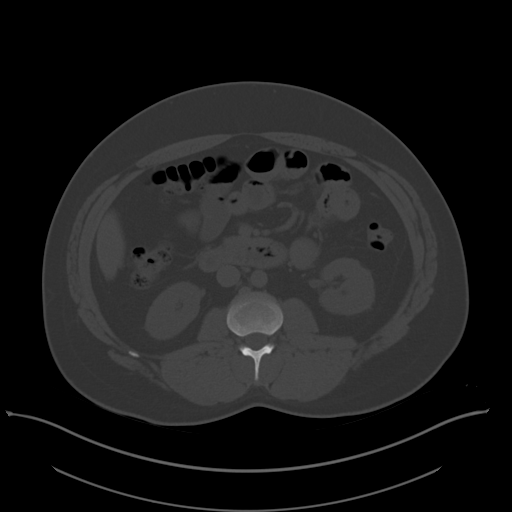
[im 74/103  soft-tissue]
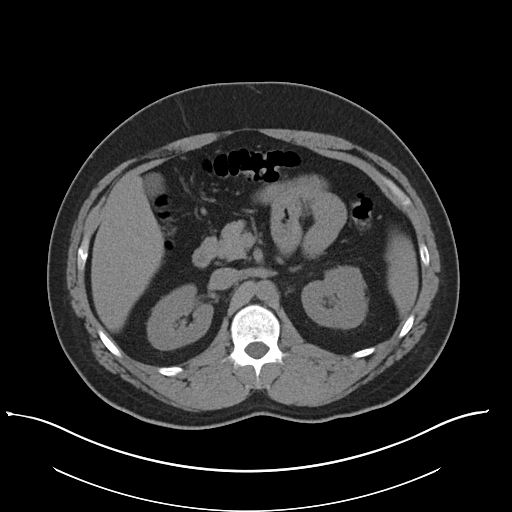
[im 82/103  soft-tissue]
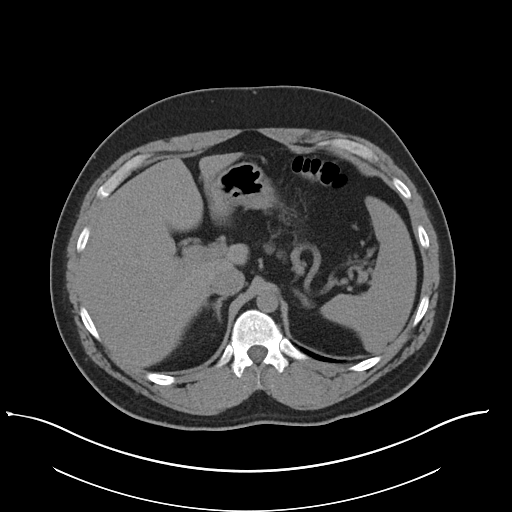
[im 90/103  soft-tissue]
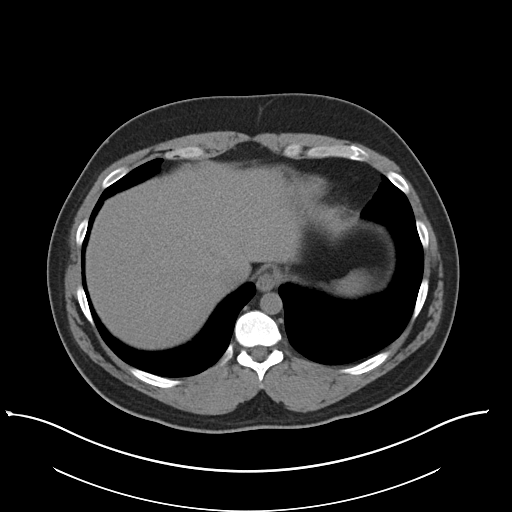
[im 98/103  soft-tissue]
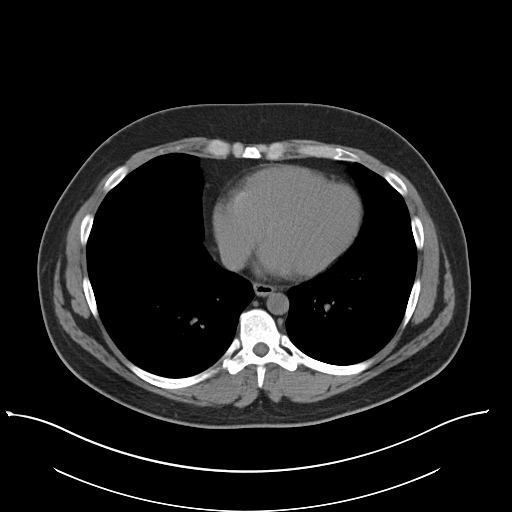

[Series 5: coronal soft tissue · coronal · 0.70mm/px · 3 of 99 slices shown]
[im 33/99  soft-tissue]
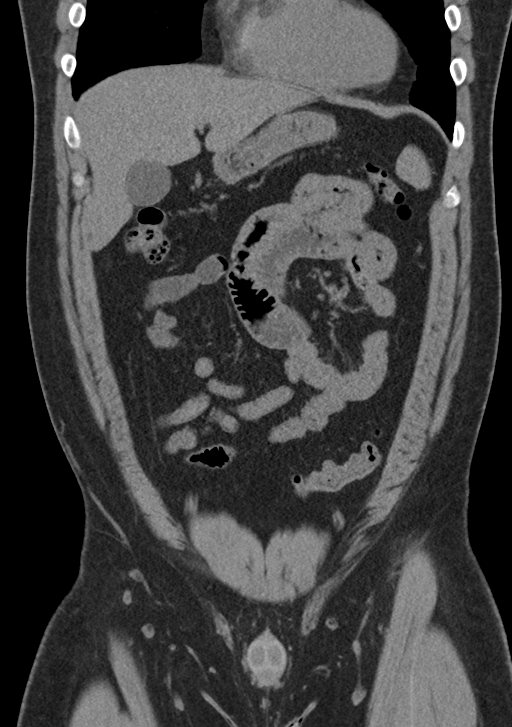
[im 44/99  soft-tissue]
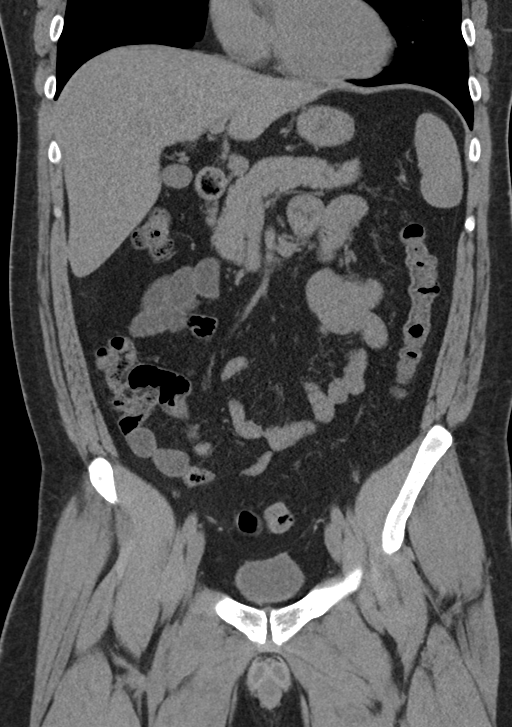
[im 55/99  soft-tissue]
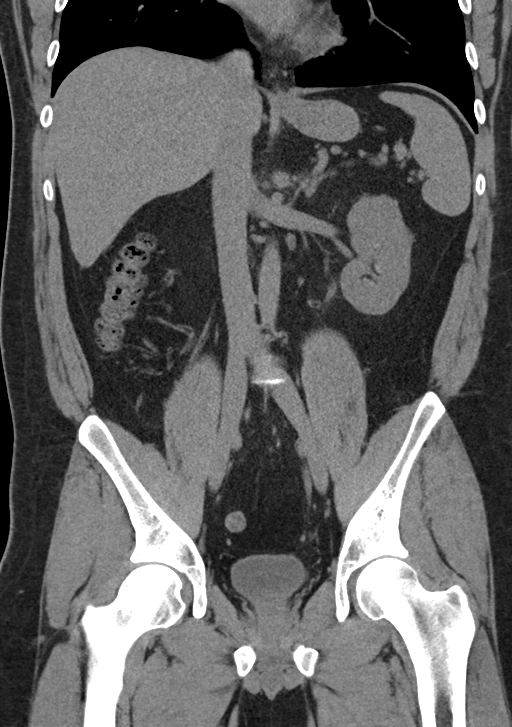

[16 of 46 positions shown; findings below may reference images not displayed]

FINDINGS: Lower chest: Lung bases demonstrate no acute infiltrate or effusion.
Smooth 3 mm perifissural nodule at the right lung base possible
lymph node. Normal heart size.

Hepatobiliary: No focal liver abnormality is seen. No gallstones,
gallbladder wall thickening, or biliary dilatation.

Pancreas: Unremarkable. No pancreatic ductal dilatation or
surrounding inflammatory changes.

Spleen: Normal in size without focal abnormality.

Adrenals/Urinary Tract: Adrenal glands within normal limits.
Punctate nonobstructing stone upper pole right kidney best seen on
coronal views. No significant left hydronephrosis however left
ureter is slightly enlarged. This is secondary to a 2 mm stone in
the distal left ureter just above the left UVJ. Bladder otherwise
normal.

Stomach/Bowel: Stomach is within normal limits. Appendix appears
normal. No evidence of bowel wall thickening, distention, or
inflammatory changes.

Vascular/Lymphatic: No significant vascular findings are present. No
enlarged abdominal or pelvic lymph nodes.

Reproductive: Prostate is unremarkable.

Other: Small fat containing umbilical hernia. No abdominopelvic
ascites.

Musculoskeletal: No acute or significant osseous findings.
IMPRESSION: 1. Slight enlargement of left ureter secondary to a 2 mm stone in
the distal left ureter just above the left UVJ. No significant left
hydronephrosis.
2. Punctate nonobstructing stone upper pole right kidney.
